# Patient Record
Sex: Female | Born: 1965 | Race: Black or African American | Hispanic: No | Marital: Single | Smoking: Never smoker
Health system: Southern US, Community
[De-identification: ages and names within clinical notes are randomized; demographics above are authoritative.]

## PROBLEM LIST (undated history)

## (undated) DIAGNOSIS — I10 Essential (primary) hypertension: Secondary | ICD-10-CM

## (undated) DIAGNOSIS — E119 Type 2 diabetes mellitus without complications: Secondary | ICD-10-CM

## (undated) HISTORY — PX: CHOLECYSTECTOMY: SHX55

## (undated) HISTORY — PX: ABDOMINAL HYSTERECTOMY: SHX81

---

## 2018-06-15 ENCOUNTER — Emergency Department: Payer: Self-pay

## 2018-06-15 ENCOUNTER — Emergency Department
Admission: EM | Admit: 2018-06-15 | Discharge: 2018-06-15 | Disposition: A | Discharge status: Home / Self Care | Payer: Self-pay | Attending: Emergency Medicine | Admitting: Emergency Medicine

## 2018-06-15 DIAGNOSIS — R51 Headache: Secondary | ICD-10-CM | POA: Insufficient documentation

## 2018-06-15 DIAGNOSIS — I10 Essential (primary) hypertension: Secondary | ICD-10-CM | POA: Insufficient documentation

## 2018-06-15 DIAGNOSIS — R519 Headache, unspecified: Secondary | ICD-10-CM

## 2018-06-15 DIAGNOSIS — R110 Nausea: Secondary | ICD-10-CM | POA: Insufficient documentation

## 2018-06-15 DIAGNOSIS — R404 Transient alteration of awareness: Secondary | ICD-10-CM | POA: Insufficient documentation

## 2018-06-15 DIAGNOSIS — E119 Type 2 diabetes mellitus without complications: Secondary | ICD-10-CM | POA: Insufficient documentation

## 2018-06-15 HISTORY — DX: Essential (primary) hypertension: I10

## 2018-06-15 HISTORY — DX: Type 2 diabetes mellitus without complications: E11.9

## 2018-06-15 LAB — COMPREHENSIVE METABOLIC PANEL
ALT: 18 U/L (ref 0–44)
AST: 21 U/L (ref 15–41)
Albumin: 4.5 g/dL (ref 3.5–5.0)
Alkaline Phosphatase: 51 U/L (ref 38–126)
Anion gap: 10 (ref 5–15)
BUN: 13 mg/dL (ref 6–20)
CHLORIDE: 103 mmol/L (ref 98–111)
CO2: 25 mmol/L (ref 22–32)
CREATININE: 0.87 mg/dL (ref 0.44–1.00)
Calcium: 9.3 mg/dL (ref 8.9–10.3)
GFR calc Af Amer: >60 mL/min (ref >60)
Glucose, Bld: 407 mg/dL — ABNORMAL HIGH (ref 70–99)
Potassium: 3.7 mmol/L (ref 3.5–5.1)
SODIUM: 138 mmol/L (ref 135–145)
Total Bilirubin: 0.4 mg/dL (ref 0.3–1.2)
Total Protein: 7.9 g/dL (ref 6.5–8.1)

## 2018-06-15 LAB — CBC WITH DIFFERENTIAL/PLATELET
BASOS PCT: 1 %
Basophils Absolute: 0 10*3/uL (ref 0–0.1)
EOS ABS: 0.1 10*3/uL (ref 0–0.7)
Eosinophils Relative: 1 %
HEMATOCRIT: 37.9 % (ref 35.0–47.0)
HEMOGLOBIN: 12.6 g/dL (ref 12.0–16.0)
LYMPHS ABS: 2.1 10*3/uL (ref 1.0–3.6)
Lymphocytes Relative: 33 %
MCH: 28.2 pg (ref 26.0–34.0)
MCHC: 33.3 g/dL (ref 32.0–36.0)
MCV: 84.6 fL (ref 80.0–100.0)
Monocytes Absolute: 0.5 10*3/uL (ref 0.2–0.9)
Monocytes Relative: 7 %
NEUTROS ABS: 3.7 10*3/uL (ref 1.4–6.5)
Neutrophils Relative %: 58 %
Platelets: 177 10*3/uL (ref 150–440)
RBC: 4.48 MIL/uL (ref 3.80–5.20)
RDW: 13.3 % (ref 11.5–14.5)
WBC: 6.3 10*3/uL (ref 3.6–11.0)

## 2018-06-15 LAB — ETHANOL

## 2018-06-15 LAB — T4, FREE: Free T4: 0.81 ng/dL — ABNORMAL LOW (ref 0.82–1.77)

## 2018-06-15 LAB — URINALYSIS, ROUTINE W REFLEX MICROSCOPIC
Bacteria, UA: NONE SEEN
Bilirubin Urine: NEGATIVE
HGB URINE DIPSTICK: NEGATIVE
KETONES UR: NEGATIVE mg/dL
Leukocytes, UA: NEGATIVE
Nitrite: NEGATIVE
PROTEIN: NEGATIVE mg/dL
Specific Gravity, Urine: 1.021 (ref 1.005–1.030)
pH: 5 (ref 5.0–8.0)

## 2018-06-15 LAB — URINE DRUG SCREEN, QUALITATIVE (ARMC ONLY)
Amphetamines, Ur Screen: NOT DETECTED
Barbiturates, Ur Screen: NOT DETECTED
CANNABINOID 50 NG, UR ~~LOC~~: NOT DETECTED
Cocaine Metabolite,Ur ~~LOC~~: NOT DETECTED
MDMA (Ecstasy)Ur Screen: NOT DETECTED
Methadone Scn, Ur: NOT DETECTED
Opiate, Ur Screen: NOT DETECTED
PHENCYCLIDINE (PCP) UR S: NOT DETECTED
Tricyclic, Ur Screen: NOT DETECTED

## 2018-06-15 LAB — TSH: TSH: 1.579 u[IU]/mL (ref 0.350–4.500)

## 2018-06-15 LAB — GLUCOSE, CAPILLARY: Glucose-Capillary: 373 mg/dL — ABNORMAL HIGH (ref 70–99)

## 2018-06-15 LAB — LIPASE, BLOOD: Lipase: 37 U/L (ref 11–51)

## 2018-06-15 LAB — HCG, QUANTITATIVE, PREGNANCY: HCG, BETA CHAIN, QUANT, S: 1 m[IU]/mL (ref <5)

## 2018-06-15 LAB — TROPONIN I: Troponin I: <0.03 ng/mL (ref <0.03)

## 2018-06-15 LAB — BRAIN NATRIURETIC PEPTIDE: B Natriuretic Peptide: 18 pg/mL (ref 0.0–100.0)

## 2018-06-15 MED ORDER — PROCHLORPERAZINE EDISYLATE 10 MG/2ML IJ SOLN
10.0000 mg | Freq: Once | INTRAMUSCULAR | Status: AC
  Administered 2018-06-15: 10 mg via INTRAVENOUS
  Filled 2018-06-15: qty 2

## 2018-06-15 MED ORDER — ASPIRIN 81 MG PO CHEW
324.0000 mg | CHEWABLE_TABLET | Freq: Once | ORAL | Status: AC
  Administered 2018-06-15: 324 mg via ORAL
  Filled 2018-06-15: qty 4

## 2018-06-15 NOTE — ED Provider Notes (Signed)
[facility] Emergency Department Provider Note  ____________________________________________   First MD Initiated Contact with Patient 06/15/18 [phone]     (approximate)  I have reviewed the triage vital signs and the nursing notes.   HISTORY  Chief Complaint Dizziness and Nausea Level 5 exemption history limited by the patient's clinical condition  HPI Lisa Holden is a 52 y.o. female who comes to the emergency department reporting nausea, lightheadedness, and "not feeling right" that began several hours ago while at work.   The patient said she was at work this evening when she began to feel "off".  Did not come on all of a sudden.  It came on gradually.  Symptoms were mild to moderate.  Nothing seems to make them better or worse.  When she arrived at registration to check into our emergency department she gave her wrong name and seemed confused to the triage nurse.  No focal deficits noted and she was brought straight back.  The patient herself seems unclear exactly why she is in the emergency department aside from feeling generally weak and lightheaded and nauseated.   Past Medical History:  Diagnosis Date  . Diabetes mellitus without complication (HCC)   . Hypertension     There are no active problems to display for this patient.   Past Surgical History:  Procedure Laterality Date  . ABDOMINAL HYSTERECTOMY    . CHOLECYSTECTOMY      Prior to Admission medications   Not on File    Allergies Penicillins  No family history on file.  Social History Social History   Tobacco Use  . Smoking status: Never Smoker  . Smokeless tobacco: Never Used  Substance Use Topics  . Alcohol use: Yes  . Drug use: Never    Review of Systems Level 5 exemption history limited by the patient's clinical condition  ____________________________________________   PHYSICAL EXAM:  VITAL SIGNS: ED Triage Vitals  Enc Vitals Group     BP      Pulse    Resp      Temp      Temp src      SpO2      Weight      Height      Head Circumference      Peak Flow      Pain Score      Pain Loc      Pain Edu?      Excl. in GC?     Constitutional: Alert and oriented x4 although speaking slowly and methodically. Eyes: PERRL EOMI. midrange and brisk.  No nystagmus Head: Atraumatic. Nose: No congestion/rhinnorhea. Mouth/Throat: No trismus Neck: No stridor.  No meningismus Cardiovascular: Normal rate, regular rhythm. Grossly normal heart sounds.  Good peripheral circulation. Respiratory: Normal respiratory effort.  No retractions. Lungs CTAB and moving good air Gastrointestinal: Soft nontender Musculoskeletal: No lower extremity edema   Neurologic:  Normal speech and language. No gross focal neurologic deficits are appreciated. Skin:  Skin is warm, dry and intact. No rash noted. Psychiatric: Mood and affect are normal. Speech and behavior are normal.    ____________________________________________   DIFFERENTIAL includes but not limited to  Stroke, TIA, DKA, HHS, hypertensive encephalopathy ____________________________________________   LABS (all labs ordered are listed, but only abnormal results are displayed)  Labs Reviewed  COMPREHENSIVE METABOLIC PANEL - Abnormal; Notable for the following components:      Result Value   Glucose, Bld 407 (*)    All other components within normal  limits  URINALYSIS, ROUTINE W REFLEX MICROSCOPIC - Abnormal; Notable for the following components:   Color, Urine COLORLESS (*)    APPearance CLEAR (*)    Glucose, UA >=500 (*)    All other components within normal limits  URINE DRUG SCREEN, QUALITATIVE ([facility] ONLY) - Abnormal; Notable for the following components:   Benzodiazepine, Ur Scrn TEST NOT PERFORMED, REAGENT NOT AVAILABLE (*)    All other components within normal limits  T4, FREE - Abnormal; Notable for the following components:   Free T4 0.81 (*)    All other components within normal  limits  GLUCOSE, CAPILLARY - Abnormal; Notable for the following components:   Glucose-Capillary 373 (*)    All other components within normal limits  CBC WITH DIFFERENTIAL/PLATELET  ETHANOL  HCG, QUANTITATIVE, PREGNANCY  TROPONIN I  BRAIN NATRIURETIC PEPTIDE  LIPASE, BLOOD  TSH    Lab work reviewed by me with no clear etiology of the patient's altered mental status identified __________________________________________  EKG  EKG reviewed by me normal sinus rhythm 73 normal intervals normal axis no ST changes normal EKG ____________________________________________  RADIOLOGY  CT scan of the head reviewed by me with no acute disease ____________________________________________   PROCEDURES  Procedure(s) performed: no  Procedures  Critical Care performed: no  ____________________________________________   INITIAL IMPRESSION / ASSESSMENT AND PLAN / ED COURSE  Pertinent labs & imaging results that were available during my care of the patient were reviewed by me and considered in my medical decision making (see chart for details).   As part of my medical decision making, I reviewed the following data within the electronic MEDICAL RECORD NUMBER History obtained from family if available, nursing notes, old chart and ekg, as well as notes from prior ED visits.  On arrival the patient is speaking somewhat slowly although is alert and oriented x4 which the nurse says is different from how she was about 5 or 10 minutes ago.  Differential is broad but includes infectious versus metabolic.  Labs, drug screen, and head CT are pending.     ----------------------------------------- 5:39 AM on 06/15/2018 -----------------------------------------  The patient is completely back to her baseline.  I offered her inpatient admission however she declined which I think is reasonable.  I have encouraged her to follow-up with her primary care physician this coming week for reexamination and she  feels welcome to return at any point.  She is discharged home in improved condition. ____________________________________________   FINAL CLINICAL IMPRESSION(S) / ED DIAGNOSES  Final diagnoses:  Nausea  Nonintractable headache, unspecified chronicity pattern, unspecified headache type  Transient alteration of awareness      NEW MEDICATIONS STARTED DURING THIS VISIT:  There are no discharge medications for this patient.    Note:  This document was prepared using Dragon voice recognition software and may include unintentional dictation errors.     Merrily Brittleifenbark, Race Latour, MD 06/18/18 1052

## 2018-06-15 NOTE — ED Triage Notes (Signed)
Patient c/o nausea and dizziness this evening. Patient asked for her phone number and personal information by registration and provided incorrect information. Patient with unsteady gait in lobby. Patient asked if she needed a wheelchair and was confused.

## 2018-06-15 NOTE — Discharge Instructions (Signed)
Fortunately today your lab work, your CT scan, and your x-ray were reassuring.  I offered you admission to the hospital however you declined saying you would prefer to go home and follow-up with your doctor this coming Monday which I think is reasonable.  Please begin taking a baby aspirin every day and follow-up with your primary care this coming Monday for a recheck.  Return to the emergency department sooner for any concerns whatsoever.  It was a pleasure to take care of you today, and thank you for coming to our emergency department.  If you have any questions or concerns before leaving please ask the nurse to grab me and I'm more than happy to go through your aftercare instructions again.  If you were prescribed any opioid pain medication today such as Norco, Vicodin, Percocet, morphine, hydrocodone, or oxycodone please make sure you do not drive when you are taking this medication as it can alter your ability to drive safely.  If you have any concerns once you are home that you are not improving or are in fact getting worse before you can make it to your follow-up appointment, please do not hesitate to call [phone] and come back for further evaluation.  Merrily BrittleNeil Kia Stavros, MD  Results for orders placed or performed during the hospital encounter of 06/15/18  Comprehensive metabolic panel  Result Value Ref Range   Sodium 138 135 - 145 mmol/L   Potassium 3.7 3.5 - 5.1 mmol/L   Chloride 103 98 - 111 mmol/L   CO2 25 22 - 32 mmol/L   Glucose, Bld 407 (H) 70 - 99 mg/dL   BUN 13 6 - 20 mg/dL   Creatinine, Ser 2.950.87 0.44 - 1.00 mg/dL   Calcium 9.3 8.9 - 28.410.3 mg/dL   Total Protein 7.9 6.5 - 8.1 g/dL   Albumin 4.5 3.5 - 5.0 g/dL   AST 21 15 - 41 U/L   ALT 18 0 - 44 U/L   Alkaline Phosphatase 51 38 - 126 U/L   Total Bilirubin 0.4 0.3 - 1.2 mg/dL   GFR calc non Af Amer >60 >60 mL/min   GFR calc Af Amer >60 >60 mL/min   Anion gap 10 5 - 15  CBC WITH DIFFERENTIAL  Result Value Ref Range   WBC 6.3 3.6 -  11.0 K/uL   RBC 4.48 3.80 - 5.20 MIL/uL   Hemoglobin 12.6 12.0 - 16.0 g/dL   HCT 13.237.9 44.035.0 - 10.247.0 %   MCV 84.6 80.0 - 100.0 fL   MCH 28.2 26.0 - 34.0 pg   MCHC 33.3 32.0 - 36.0 g/dL   RDW 72.513.3 36.611.5 - 44.014.5 %   Platelets 177 150 - 440 K/uL   Neutrophils Relative % 58 %   Neutro Abs 3.7 1.4 - 6.5 K/uL   Lymphocytes Relative 33 %   Lymphs Abs 2.1 1.0 - 3.6 K/uL   Monocytes Relative 7 %   Monocytes Absolute 0.5 0.2 - 0.9 K/uL   Eosinophils Relative 1 %   Eosinophils Absolute 0.1 0 - 0.7 K/uL   Basophils Relative 1 %   Basophils Absolute 0.0 0 - 0.1 K/uL  Ethanol  Result Value Ref Range   Alcohol, Ethyl (B) <10 <10 mg/dL  Urinalysis, Routine w reflex microscopic  Result Value Ref Range   Color, Urine COLORLESS (A) YELLOW   APPearance CLEAR (A) CLEAR   Specific Gravity, Urine 1.021 1.005 - 1.030   pH 5.0 5.0 - 8.0   Glucose, UA >=500 (A) NEGATIVE  mg/dL   Hgb urine dipstick NEGATIVE NEGATIVE   Bilirubin Urine NEGATIVE NEGATIVE   Ketones, ur NEGATIVE NEGATIVE mg/dL   Protein, ur NEGATIVE NEGATIVE mg/dL   Nitrite NEGATIVE NEGATIVE   Leukocytes, UA NEGATIVE NEGATIVE   RBC / HPF 0-5 0 - 5 RBC/hpf   WBC, UA 0-5 0 - 5 WBC/hpf   Bacteria, UA NONE SEEN NONE SEEN   Squamous Epithelial / LPF 0-5 0 - 5  Urine Drug Screen, Qualitative  Result Value Ref Range   Tricyclic, Ur Screen NONE DETECTED NONE DETECTED   Amphetamines, Ur Screen NONE DETECTED NONE DETECTED   MDMA (Ecstasy)Ur Screen NONE DETECTED NONE DETECTED   Cocaine Metabolite,Ur [place] NONE DETECTED NONE DETECTED   Opiate, Ur Screen NONE DETECTED NONE DETECTED   Phencyclidine (PCP) Ur S NONE DETECTED NONE DETECTED   Cannabinoid 50 Ng, Ur [place] NONE DETECTED NONE DETECTED   Barbiturates, Ur Screen NONE DETECTED NONE DETECTED   Benzodiazepine, Ur Scrn TEST NOT PERFORMED, REAGENT NOT AVAILABLE (A) NONE DETECTED   Methadone Scn, Ur NONE DETECTED NONE DETECTED  hCG, quantitative, pregnancy  Result Value Ref Range   hCG, Beta Chain,  Quant, S 1 <5 mIU/mL  Troponin I  Result Value Ref Range   Troponin I <0.03 <0.03 ng/mL  Brain natriuretic peptide  Result Value Ref Range   B Natriuretic Peptide 18.0 0.0 - 100.0 pg/mL  Lipase, blood  Result Value Ref Range   Lipase 37 11 - 51 U/L  TSH  Result Value Ref Range   TSH 1.579 0.350 - 4.500 uIU/mL  T4, free  Result Value Ref Range   Free T4 0.81 (L) 0.82 - 1.77 ng/dL  Glucose, capillary  Result Value Ref Range   Glucose-Capillary 373 (H) 70 - 99 mg/dL   Ct Head Wo Contrast  Result Date: 06/15/2018 CLINICAL DATA:  Altered level of consciousness. Nausea and dizziness this evening. Unsteady gait. EXAM: CT HEAD WITHOUT CONTRAST TECHNIQUE: Contiguous axial images were obtained from the base of the skull through the vertex without intravenous contrast. COMPARISON:  None. FINDINGS: Brain: No evidence of acute infarction, hemorrhage, hydrocephalus, extra-axial collection or mass lesion/mass effect. Vascular: No hyperdense vessel or unexpected calcification. Skull: Normal. Negative for fracture or focal lesion. Sinuses/Orbits: No acute finding. Other: None. IMPRESSION: No acute intracranial abnormality. Electronically Signed   By: Burman NievesWilliam  Stevens M.D.   On: 06/15/2018 05:20   Dg Chest Port 1 View  Result Date: 06/15/2018 CLINICAL DATA:  Nausea and dizziness this evening. Shortness of breath. EXAM: PORTABLE CHEST 1 VIEW COMPARISON:  None. FINDINGS: The heart size and mediastinal contours are within normal limits. Both lungs are clear. The visualized skeletal structures are unremarkable. IMPRESSION: No active disease. Electronically Signed   By: Burman NievesWilliam  Stevens M.D.   On: 06/15/2018 03:44

## 2019-05-18 IMAGING — CT CT HEAD W/O CM
3 series · 17 of 47 positions shown, 20 images · non-contrast
Comparison: None.

CLINICAL DATA: Altered level of consciousness. Nausea and dizziness
this evening. Unsteady gait.

EXAM:
CT HEAD WITHOUT CONTRAST
TECHNIQUE: Contiguous axial images were obtained from the base of the skull
through the vertex without intravenous contrast.

[Series 3: head wo · axial · 0.39mm/px · z∈[-107,+18]mm · 11 of 30 slices shown, 14 images]
[im 3/30  brain]
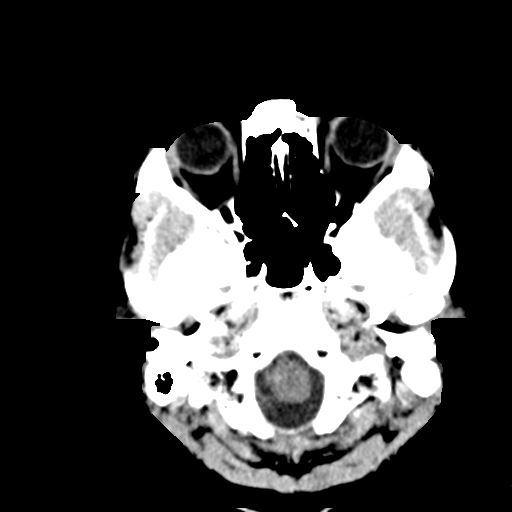
[im 3/30  bone]
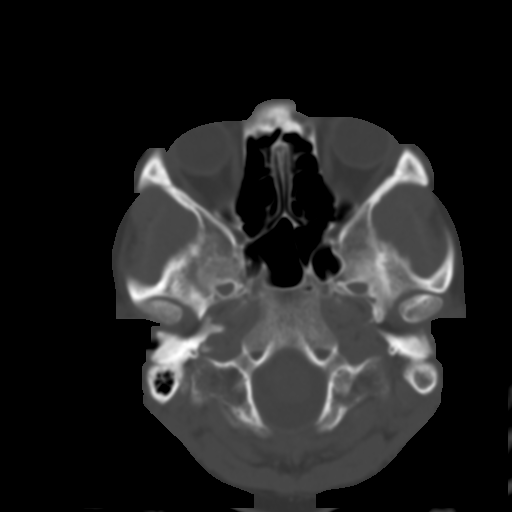
[im 5/30  brain]
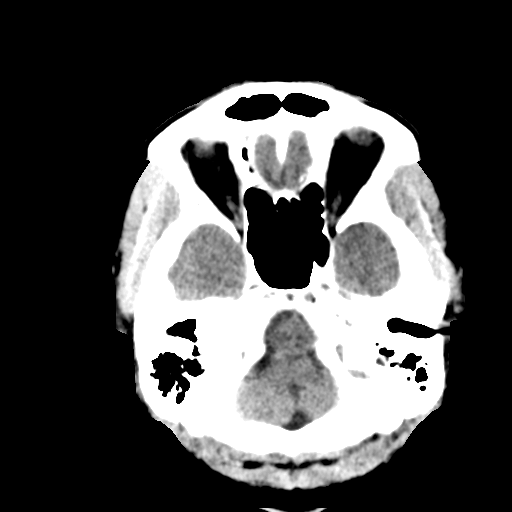
[im 8/30  brain]
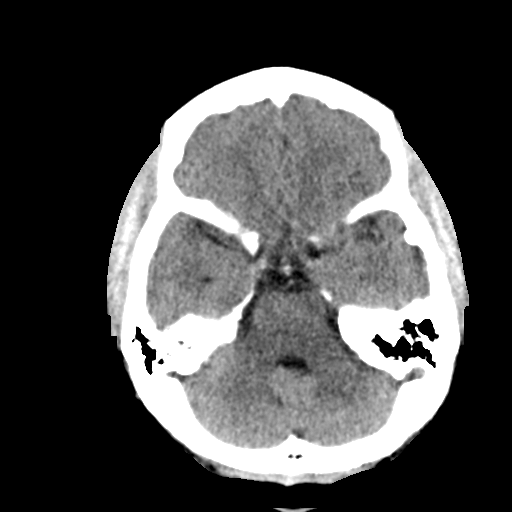
[im 10/30  brain]
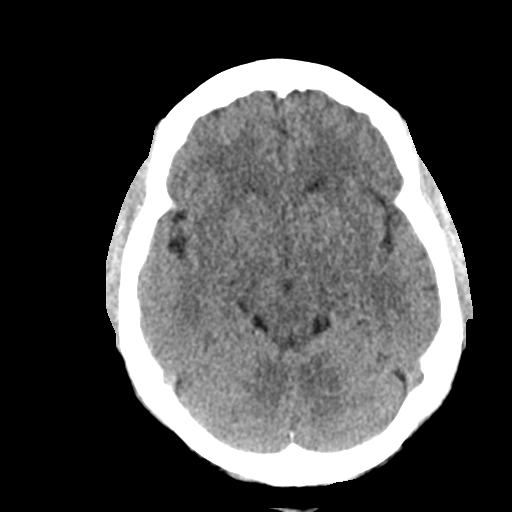
[im 13/30  brain]
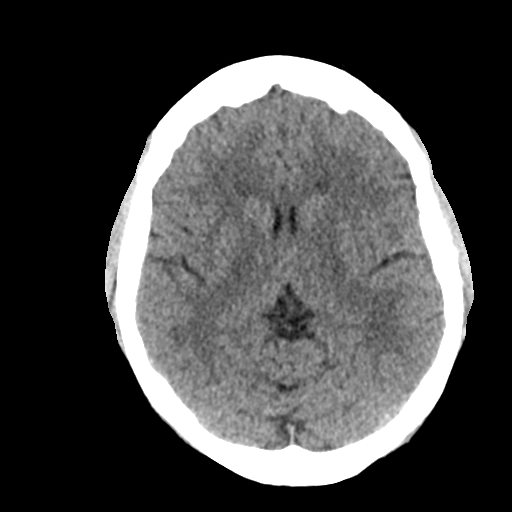
[im 13/30  bone]
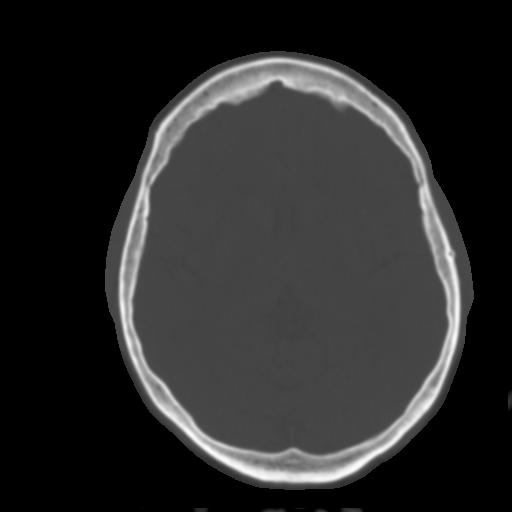
[im 16/30  brain]
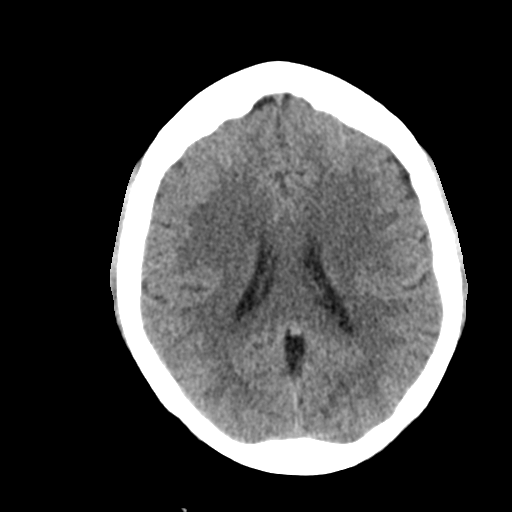
[im 18/30  brain]
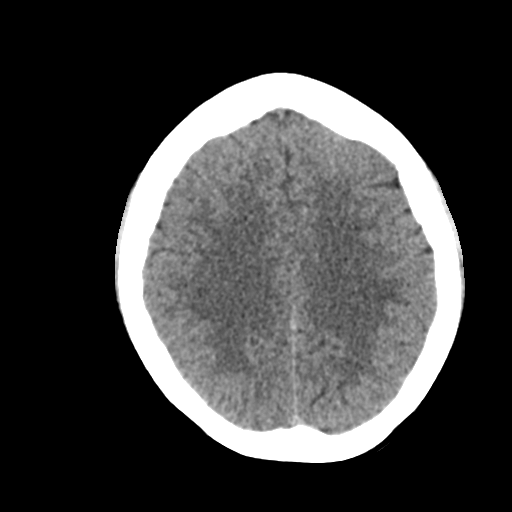
[im 21/30  brain]
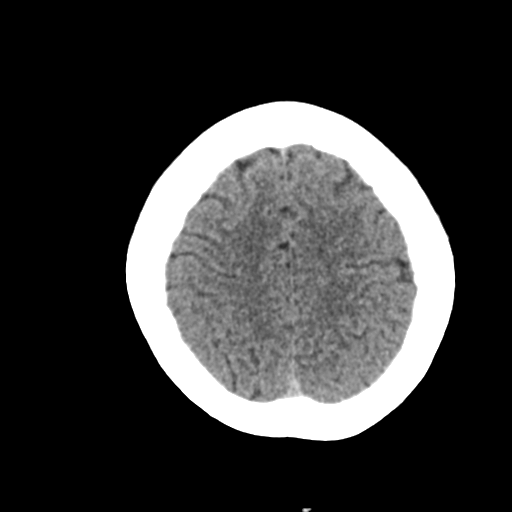
[im 23/30  brain]
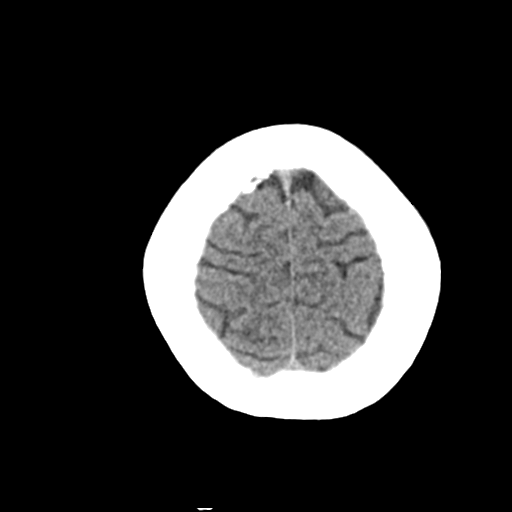
[im 23/30  bone]
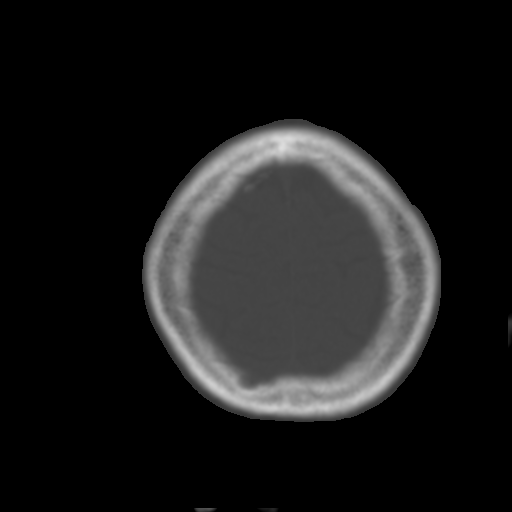
[im 26/30  brain]
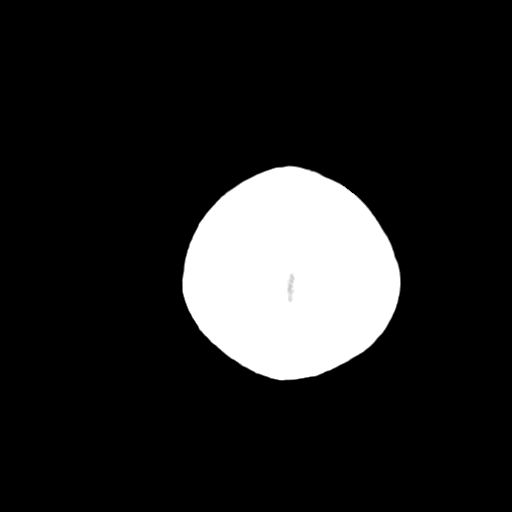
[im 28/30  brain]
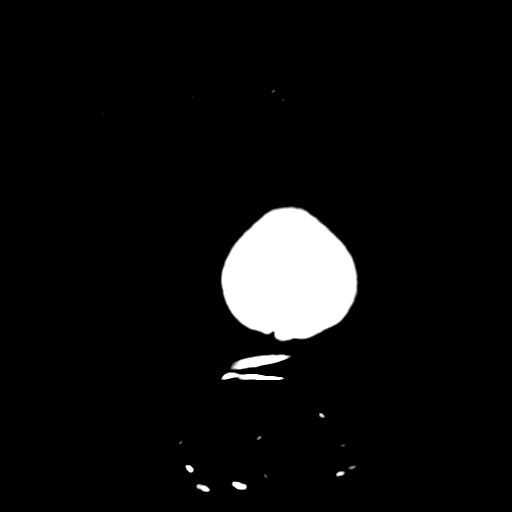

[Series 4: coronal soft tissue · coronal · 0.31mm/px · 3 of 60 slices shown]
[im 20/60  brain]
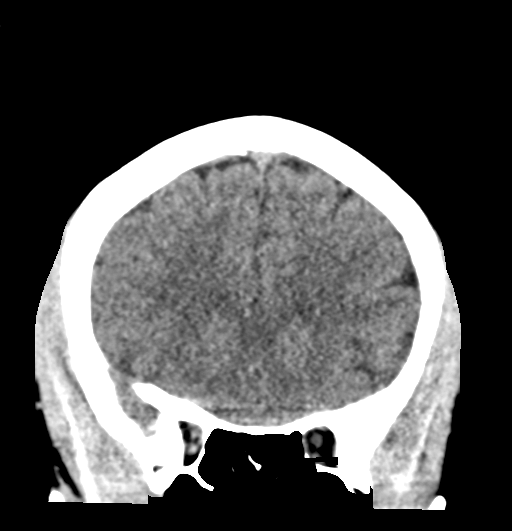
[im 27/60  brain]
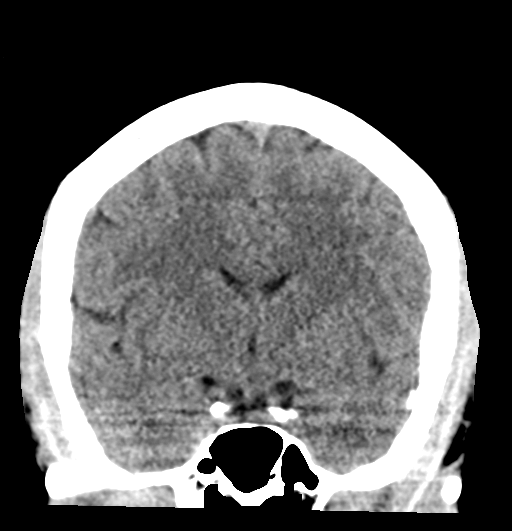
[im 33/60  brain]
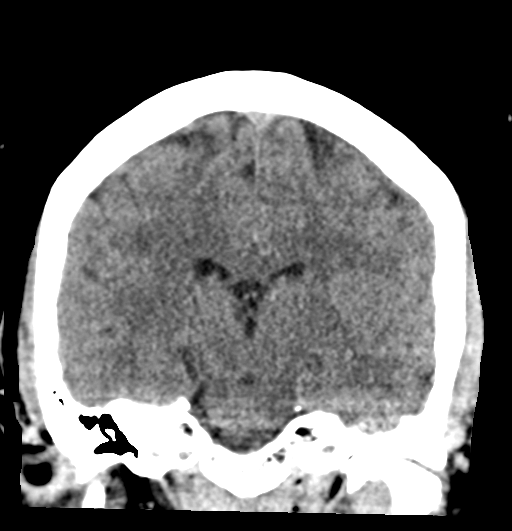

[Series 5: sagittal soft tissue · sagittal · 0.32mm/px · 3 of 54 slices shown]
[im 18/54  brain]
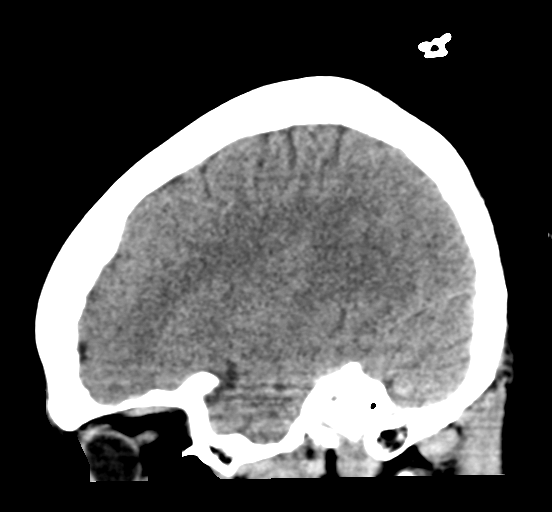
[im 27/54  brain]
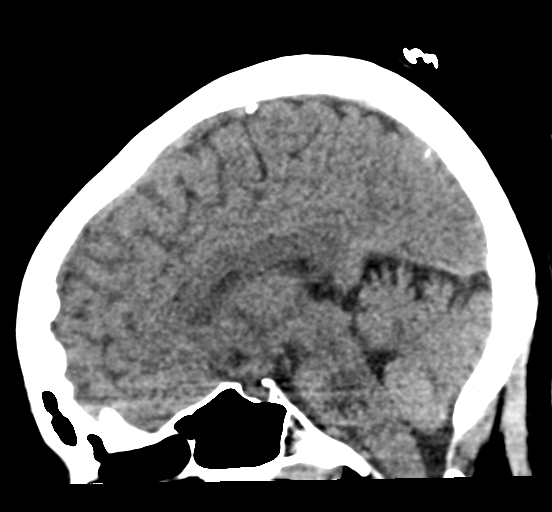
[im 36/54  brain]
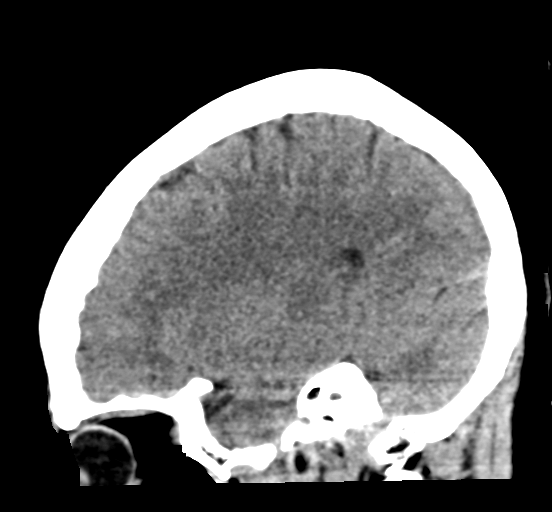

[17 of 47 positions shown; findings below may reference images not displayed]

FINDINGS: Brain: No evidence of acute infarction, hemorrhage, hydrocephalus,
extra-axial collection or mass lesion/mass effect.

Vascular: No hyperdense vessel or unexpected calcification.

Skull: Normal. Negative for fracture or focal lesion.

Sinuses/Orbits: No acute finding.

Other: None.
IMPRESSION: No acute intracranial abnormality.
